# Patient Record
Sex: Female | Born: 1935
Health system: Southern US, Community
[De-identification: ages and names within clinical notes are randomized; demographics above are authoritative.]

---

## 2004-09-10 ENCOUNTER — Ambulatory Visit: Payer: Self-pay | Admitting: Family Medicine

## 2004-11-18 ENCOUNTER — Ambulatory Visit: Payer: Self-pay | Admitting: Family Medicine

## 2004-12-28 ENCOUNTER — Ambulatory Visit: Payer: Self-pay | Admitting: Surgery

## 2005-04-20 ENCOUNTER — Ambulatory Visit: Payer: Self-pay | Admitting: Family Medicine

## 2005-06-07 ENCOUNTER — Other Ambulatory Visit: Payer: Self-pay

## 2005-06-07 ENCOUNTER — Ambulatory Visit: Payer: Self-pay | Admitting: Unknown Physician Specialty

## 2005-06-11 ENCOUNTER — Inpatient Hospital Stay: Payer: Self-pay | Admitting: Unknown Physician Specialty

## 2006-06-15 ENCOUNTER — Ambulatory Visit: Payer: Self-pay | Admitting: Vascular Surgery

## 2006-08-03 ENCOUNTER — Ambulatory Visit: Payer: Self-pay | Admitting: Gastroenterology

## 2007-03-04 ENCOUNTER — Ambulatory Visit: Payer: Self-pay | Admitting: Internal Medicine

## 2007-07-23 ENCOUNTER — Ambulatory Visit: Payer: Self-pay | Admitting: Family Medicine

## 2007-07-26 ENCOUNTER — Ambulatory Visit: Payer: Self-pay | Admitting: Family Medicine

## 2007-11-23 ENCOUNTER — Ambulatory Visit: Payer: Self-pay | Admitting: Family Medicine

## 2007-12-29 ENCOUNTER — Ambulatory Visit: Payer: Self-pay | Admitting: Dermatology

## 2008-03-08 ENCOUNTER — Ambulatory Visit: Payer: Self-pay | Admitting: Dermatology

## 2008-05-31 ENCOUNTER — Ambulatory Visit: Payer: Self-pay | Admitting: Dermatology

## 2008-06-16 ENCOUNTER — Ambulatory Visit: Payer: Self-pay | Admitting: Internal Medicine

## 2008-06-21 ENCOUNTER — Ambulatory Visit: Payer: Self-pay | Admitting: Family Medicine

## 2008-07-10 ENCOUNTER — Ambulatory Visit: Payer: Self-pay | Admitting: Internal Medicine

## 2008-07-13 ENCOUNTER — Inpatient Hospital Stay: Payer: Self-pay | Admitting: Internal Medicine

## 2008-09-10 ENCOUNTER — Ambulatory Visit: Payer: Self-pay | Admitting: Dermatology

## 2008-12-30 ENCOUNTER — Ambulatory Visit: Payer: Self-pay | Admitting: Dermatology

## 2009-02-10 ENCOUNTER — Ambulatory Visit: Payer: Self-pay | Admitting: Family Medicine

## 2009-05-20 ENCOUNTER — Ambulatory Visit: Payer: Self-pay | Admitting: Dermatology

## 2009-08-26 ENCOUNTER — Ambulatory Visit: Payer: Self-pay | Admitting: Family Medicine

## 2009-09-06 ENCOUNTER — Ambulatory Visit: Payer: Self-pay | Admitting: Internal Medicine

## 2009-09-14 ENCOUNTER — Ambulatory Visit: Payer: Self-pay | Admitting: Internal Medicine

## 2010-02-09 ENCOUNTER — Ambulatory Visit: Payer: Self-pay | Admitting: Internal Medicine

## 2010-08-24 ENCOUNTER — Ambulatory Visit: Payer: Self-pay | Admitting: Internal Medicine

## 2011-01-21 ENCOUNTER — Ambulatory Visit: Payer: Self-pay | Admitting: Internal Medicine

## 2011-01-25 ENCOUNTER — Ambulatory Visit: Payer: Self-pay | Admitting: Internal Medicine

## 2012-12-23 ENCOUNTER — Ambulatory Visit: Payer: Self-pay

## 2013-05-15 ENCOUNTER — Inpatient Hospital Stay: Payer: Self-pay | Admitting: Family Medicine

## 2013-05-15 LAB — TROPONIN I
Troponin-I: <0.02 ng/mL
Troponin-I: <0.02 ng/mL
Troponin-I: <0.02 ng/mL

## 2013-05-15 LAB — COMPREHENSIVE METABOLIC PANEL
Albumin: 3 g/dL — ABNORMAL LOW (ref 3.4–5.0)
Alkaline Phosphatase: 116 U/L
Anion Gap: 2 — ABNORMAL LOW (ref 7–16)
BUN: 27 mg/dL — AB (ref 7–18)
Bilirubin,Total: 0.3 mg/dL (ref 0.2–1.0)
CHLORIDE: 106 mmol/L (ref 98–107)
CO2: 30 mmol/L (ref 21–32)
Calcium, Total: 8.6 mg/dL (ref 8.5–10.1)
Creatinine: 0.98 mg/dL (ref 0.60–1.30)
EGFR (African American): >60
GFR CALC NON AF AMER: 55 — AB
Glucose: 112 mg/dL — ABNORMAL HIGH (ref 65–99)
OSMOLALITY: 282 (ref 275–301)
Potassium: 4.2 mmol/L (ref 3.5–5.1)
SGOT(AST): 28 U/L (ref 15–37)
SGPT (ALT): 18 U/L (ref 12–78)
SODIUM: 138 mmol/L (ref 136–145)
TOTAL PROTEIN: 7 g/dL (ref 6.4–8.2)

## 2013-05-15 LAB — CBC
HCT: 38.1 % (ref 35.0–47.0)
HGB: 12.5 g/dL (ref 12.0–16.0)
MCH: 29.4 pg (ref 26.0–34.0)
MCHC: 32.9 g/dL (ref 32.0–36.0)
MCV: 90 fL (ref 80–100)
Platelet: 216 10*3/uL (ref 150–440)
RBC: 4.25 10*6/uL (ref 3.80–5.20)
RDW: 14.8 % — ABNORMAL HIGH (ref 11.5–14.5)
WBC: 5.2 10*3/uL (ref 3.6–11.0)

## 2013-05-15 LAB — CK-MB
CK-MB: 1.8 ng/mL (ref 0.5–3.6)
CK-MB: 1.5 ng/mL (ref 0.5–3.6)
CK-MB: 1.9 ng/mL (ref 0.5–3.6)

## 2013-05-15 LAB — URINALYSIS, COMPLETE
Bilirubin,UR: NEGATIVE
GLUCOSE, UR: NEGATIVE mg/dL (ref 0–75)
Ketone: NEGATIVE
LEUKOCYTE ESTERASE: NEGATIVE
NITRITE: NEGATIVE
PROTEIN: NEGATIVE
Ph: 5 (ref 4.5–8.0)
RBC,UR: 1 /HPF (ref 0–5)
SPECIFIC GRAVITY: 1.015 (ref 1.003–1.030)
Squamous Epithelial: 7
WBC UR: <1 /HPF (ref 0–5)

## 2013-05-16 DIAGNOSIS — I369 Nonrheumatic tricuspid valve disorder, unspecified: Secondary | ICD-10-CM

## 2013-05-16 LAB — D-DIMER(ARMC): D-DIMER: 2498 ng/mL

## 2013-05-16 LAB — TSH: Thyroid Stimulating Horm: 1.13 u[IU]/mL

## 2013-05-17 ENCOUNTER — Ambulatory Visit: Payer: Self-pay | Admitting: Neurology

## 2013-05-17 LAB — CBC WITH DIFFERENTIAL/PLATELET
Basophil #: 0 10*3/uL (ref 0.0–0.1)
Basophil %: 0.7 %
EOS PCT: 3.1 %
Eosinophil #: 0.1 10*3/uL (ref 0.0–0.7)
HCT: 39.7 % (ref 35.0–47.0)
HGB: 13.1 g/dL (ref 12.0–16.0)
Lymphocyte #: 0.5 10*3/uL — ABNORMAL LOW (ref 1.0–3.6)
Lymphocyte %: 10.3 %
MCH: 29.4 pg (ref 26.0–34.0)
MCHC: 32.9 g/dL (ref 32.0–36.0)
MCV: 89 fL (ref 80–100)
MONO ABS: 0.7 x10 3/mm (ref 0.2–0.9)
Monocyte %: 14.1 %
Neutrophil #: 3.3 10*3/uL (ref 1.4–6.5)
Neutrophil %: 71.8 %
PLATELETS: 224 10*3/uL (ref 150–440)
RBC: 4.44 10*6/uL (ref 3.80–5.20)
RDW: 14.7 % — ABNORMAL HIGH (ref 11.5–14.5)
WBC: 4.6 10*3/uL (ref 3.6–11.0)

## 2013-05-17 LAB — BASIC METABOLIC PANEL
ANION GAP: 5 — AB (ref 7–16)
BUN: 12 mg/dL (ref 7–18)
Calcium, Total: 8.2 mg/dL — ABNORMAL LOW (ref 8.5–10.1)
Chloride: 108 mmol/L — ABNORMAL HIGH (ref 98–107)
Co2: 27 mmol/L (ref 21–32)
Creatinine: 0.81 mg/dL (ref 0.60–1.30)
EGFR (African American): >60
EGFR (Non-African Amer.): >60
Glucose: 139 mg/dL — ABNORMAL HIGH (ref 65–99)
OSMOLALITY: 281 (ref 275–301)
Potassium: 4 mmol/L (ref 3.5–5.1)
SODIUM: 140 mmol/L (ref 136–145)

## 2013-05-17 LAB — LIPID PANEL
Cholesterol: 118 mg/dL (ref 0–200)
HDL Cholesterol: 35 mg/dL — ABNORMAL LOW (ref 40–60)
LDL CHOLESTEROL, CALC: 53 mg/dL (ref 0–100)
Triglycerides: 148 mg/dL (ref 0–200)
VLDL CHOLESTEROL, CALC: 30 mg/dL (ref 5–40)

## 2014-06-08 NOTE — Consult Note (Signed)
PATIENT NAME:  Rachael Holland, Rachael Holland MR#:  [id number] DATE OF BIRTH:  May 18, 1932  DATE OF CONSULTATION:  05/17/2013  REFERRING PHYSICIAN:  Dr. Mordecai Maes  CONSULTING PHYSICIAN:  Pauletta Browns, MD  REASON FOR CONSULTATION: Strokes.   HISTORY OF PRESENT ILLNESS: A [age] year old female with past medical history of coronary artery disease status post stenting, history of atrial fibrillation and status post ablation in 2010, hypertension and hyperlipidemia presenting with nausea, dizziness and lightheadedness that was self-relieved. Admitted for questionable stroke status post CAT scan which had a suspicion of right frontal lobe infarct, but on further imaging such as MRI the patient has subacute infarcts in the right corona radiata and right cerebellum. Currently has no complaints, asking to be discharged.   REVIEW OF SYSTEMS:  CONSTITUTIONAL: No fever. Positive fatigue.  EYES: No blurred vision, double vision.  ENT: No ear pain, hearing loss.  RESPIRATORY: No cough, wheezing, hemoptysis.  CARDIOVASCULAR: No chest pain, orthopnea.  GASTROINTESTINAL: Positive for nausea, vomiting, diarrhea.  GENITOURINARY: No dysuria, hematuria.  ENDOCRINE: No polyuria or polydipsia. HEMATOLOGIC AND LYMPHATIC: No easy bruising or bleeding.  SKIN: No rash or lesions.  MUSCULOSKELETAL: No abnormalities.  NEUROLOGICAL: No prior history of strokes.  PSYCHIATRIC: No history of anxiety and depression.   PAST MEDICAL HISTORY: Coronary artery disease status post 2 stents, status post ablation for atrial fibrillation in 2010, asthma, COPD, GERD, restless leg syndrome, hayfever, osteoarthritis, osteoporosis, hypertension, hyperlipidemia, psoriasis, peripheral vascular disease.   PAST SURGICAL HISTORY: Includes tonsillectomy, hysterectomy, lumbosacral decompression and lumbosacral fusion, cardiac stents.   HOME MEDICATIONS: Include tramadol, pramipexole, Neurontin, Nasonex, metoprolol, losartan, Lasix, Crestor, Dilantin,  aspirin, and Brilinta.  ALLERGIES: No known drug allergies.  SOCIAL HISTORY: Lives alone. No tobacco, alcohol or drug use.   FAMILY HISTORY: Positive for coronary artery disease.   DIAGNOSTIC DATA: Laboratory work-up has been reviewed.   Imaging: Ultrasound of carotids: No hemodynamic stenosis.   CT angiogram of chest with PE protocol: No signs of PE.   MRI of the brain without contrast: Remote infarcts in right corona radiata and cerebellum.   PHYSICAL EXAMINATION: VITAL SIGNS: Include a temperature of 97.8, pulse 73, respirations 18, blood pressure 138/81, pulse ox 96% on room air.  NEUROLOGIC: The patient is alert, awake and oriented to time, place and her current location, able to tell me the reason why she is in the hospital. Speech appears to be fluent. No signs of aphasia or dysarthria. Extraocular movements are intact. Visual fields intact. Facial sensation intact. Facial motor is intact. Tongue is midline. Uvula elevates symmetrically. Shoulder shrug intact. Motor strength 5 out of 5 bilateral upper and lower extremities. Coordination: Finger-to-nose intact. Sensation intact to light touch and temperature. Reflexes 2+ symmetrical. Gait not assessed.   IMPRESSION: A 81 year old female with history of coronary artery disease, atrial fibrillation status post ablation, hypertension and hyperlipidemia presenting with dizziness which has resolved. The patient states she is back to baseline. Imaging has found, specifically MRI, right coronal radiata and right cerebellar infarct which are in the intravascular territories. The corona radiata is supplied by deep branches of the MCA, lenticular strip branches, as well as deep branches of anterior cerebral artery. The cerebellum is supplied by posterior circulation. The patient is status post echocardiogram with normal ejection fraction. No signs of any clot. I suspect this could be a cardioembolic source as strokes are remote but they are in  different vascular territories. The patient does have history of atrial fibrillation, but she is status post ablation and states  that she has not had any palpitations recently.   PLAN: Would place the patient on telemetry, increase her aspirin to 325 daily and obtain ultrasound of lower extremities to make sure there are no clots. If possible would like to obtain TEE. That can be done as outpatient, but if the patient does stay here for 1 more day we can make her n.p.o. after midnight and obtain TEE to make sure there is no clot there that would need anticoagulation. If that is negative, when the patient is discharged home, she should be placed on a Holter monitor to make sure there are no signs of atrial fibrillation or any other cardiac arrhythmia.   Thank you. It was a pleasure seeing this patient. This case was discussed with Dr. Mordecai MaesSanchez.   ____________________________ Pauletta BrownsYuriy Matayah Reyburn, MD yz:sb D: 05/17/2013 10:15:00 ET T: 05/17/2013 10:44:15 ET JOB#: [id number]  cc: Pauletta BrownsYuriy Taylorann Tkach, MD, <Dictator> Pauletta BrownsYURIY Eilis Chestnutt MD ELECTRONICALLY SIGNED 05/18/2013 11:35

## 2014-06-08 NOTE — H&P (Signed)
PATIENT NAME:  Rachael Holland, Rachael Holland MR#:  [id number] DATE OF BIRTH:  [birth year]  DATE OF ADMISSION:  05/15/2013  PRIMARY CARE PHYSICIAN: Dr. Petra Kuba  CHIEF COMPLAINT: Nausea, dizziness, lightheadedness.   HISTORY OF PRESENT ILLNESS: This is a 81 year old, very pleasant female with a history of CAD, status post stent, hypertension, hyperlipidemia, presents with the above complaint. Over the past day, the patient has had lightheadedness, dizziness, and feeling very fatigued. She came to the ER for further evaluation. In the ER, she had a CT scan, which showed an old anterior right frontal lobe infarct, which she is unaware of, but also it was noted that her heart rates are between 35 to 60. When she is up and talking, her heart rates are about 50s and 60s, but when she is lying and resting, her heart goes down.   REVIEW OF SYSTEMS: CONSTITUTIONAL: No fever. Positive fatigue for one day, and weakness.  EYES:  No blurred or double vision, glaucoma. ENT: No ear pain, hearing loss, seasonal allergies. RESPIRATORY: No cough, wheezing, hemoptysis, COPD.   CARDIOVASCULAR: No chest pain, orthopnea, edema, arrhythmia, dyspnea on exertion, palpitations, syncope.   GASTROINTESTINAL: Positive nausea. No vomiting, diarrhea, abdominal pain, melena, or ulcers.  GENITOURINARY: No dysuria or hematuria.  ENDOCRINE: No polyuria or polydipsia.  HEMATOLOGIC/LYMPHATIC: No easy bruising or bleeding.  SKIN: No rash or lesions.  MUSCULOSKELETAL: No limited activity.  NEUROLOGIC: No history of CVA seizures.  PSYCHIATRIC:  No history of anxiety or depression.   PAST MEDICAL HISTORY:  1.  History of coronary artery disease, status post 2 stents.  2.  Asthma/chronic obstructive pulmonary disease. 3.  Atrial fibrillation.  4.  GERD.  5.  Restless legs syndrome.  6.  Hayfever.  7.  Osteoarthritis.  8.  Osteoporosis.  9.  Hypertension.  10.  Hyperlipidemia. 11.  Psoriasis.  12.  Peripheral vascular disease.   SURGICAL  HISTORY: 1.  Tonsillectomy and adenoidectomy.  2.  Hysterectomy.  3.  Lumbosacral decompression.  4.  Lumbosacral fusion x 2. 5.  Cardiac stents.   MEDICATIONS: 1.  Tramadol 50 mg 1 tablet q. 4 hours p.r.n.  2.  Pramipexole 0.25 mg daily.  3.  Neurontin 600 mg at bedtime.  4.  Nasonex 2 puffs daily.  5.  Metoprolol 25 b.i.d. 6.  Losartan 25 mg daily.  7.  Lasix 20 mg p.r.n.  8.  Crestor 20 mg daily.  9.  Dilantin 90 mg b.i.d.  10.  Aspirin 81 mg daily. 11.  Brillanta 90 mg BID  ALLERGIES: No known drug allergies.  SOCIAL HISTORY:  No tobacco, alcohol, or drug use.   FAMILY HISTORY: Positive for early CAD. Her father passed away at age 36 with CAD. Mother died at 61 with bowel cancer.   PHYSICAL EXAMINATION: VITAL SIGNS: Temperature 98.7, pulse 57, respirations 18, blood pressure 145/74, 96% on room air.  GENERAL: The patient is alert, oriented, not in acute distress. HEENT: Head is atraumatic. Pupils are reactive. Sclerae anicteric. Mucous membranes are moist. Oropharynx is clear.  NECK: Supple, without JVD, carotid bruit, or enlarged thyroid.  CARDIOVASCULAR: Bradycardia, without murmur, gallops, or rubs. PMI is not displaced. LUNGS:  Clear to auscultation, without crackles, rales, rhonchi, or wheezing. Normal percussion.  ABDOMEN:  Bowel sounds are present. Nontender, nondistended. No hepatosplenomegaly. EXTREMITIES: Without clubbing, cyanosis, or edema.  NEUROLOGIC:  Cranial nerves II through XII are intact. There are no focal deficits.  SKIN: Without rash or lesions.   LABORATORIES: Sodium 138, potassium 4.2, chloride  106, bicarb 30, BUN 27, creatinine 0.92, glucose 112. Total protein 7.0, albumin 3.0, bilirubin 0.3, alk phos 116, AST 28, ALT 18. Troponin x 2 is less than 0.02. White blood cells 5.2, hemoglobin 12.5, hematocrit 38.1, platelets are 260. Urinalysis shows no LCE or nitrites.   EKG:  Sinus bradycardia. No ST elevation or depression.   ASSESSMENT AND PLAN: A  81 year old female who is on metoprolol for coronary artery disease, who presents with nausea, dizziness, found to have bradycardia.   1. Bradycardia. The patient is symptomatic from her bradycardia, with nausea, dizziness, lightheadedness, and fatigue. Will hold metoprolol, monitor the patient on telemetry, order an echocardiogram, order a TSH, and complete her cardiac enzyme panel. At this time, I do not feel that patient needs a Cardiology consultation. However, due to the fact that patient, when she is resting, she has bradycardia. I think if we hold the metoprolol, her heart rate may be better. If it continues to be low and she is symptomatic, then we will obtain a Cardiology consultation. She does not have a cardiologist preference, as she does see Dr. Petra Kuba, who does not come to [facility].   2. Hypertension. I will continue losartan, but I have stopped metoprolol for the above issues. The patient may need another hypertenisve medication at discharge, in addition to the losartan.   3.  Hyperlipidemia. I will continue her statin medication.   4.  History of paroxysmal atrial fibrillation. The patient is not in atrial fibrillation at this time. I will continue Brillanta. I am stopping metoprolo due to her bradycardia.  5.  Restless legs syndrome. I will continue Mirapex.   6.  Abnormal head CT. CT scan shows an old infarct. She is unaware of this. Due to her admitting symptoms, I will order an MRi to evalaute for an acute CVA.  If it is positive for an acute CVA, she will need further CVA workup. I have ordered neuro checks q. 4 hours as well.   7.  The patient is FULL CODE STATUS.   Time spent:  Approximately 45 minutes.    ____________________________ Donell Beers. Benjie Karvonen, MD spm:mr D: 05/15/2013 18:35:37 ET T: 05/15/2013 19:03:34 ET JOB#: [id number]  cc: Thalia Turkington P. Benjie Karvonen, MD, <Dictator> Hall Busing. Petra Kuba, MD  Donell Beers Mirranda Monrroy MD ELECTRONICALLY SIGNED 05/16/2013 13:10

## 2014-06-08 NOTE — Discharge Summary (Signed)
PATIENT NAME:  Rachael Holland, AMORE MR#:  [id number] DATE OF BIRTH:  Mar 30, 1932  DATE OF ADMISSION:  05/15/2013 DATE OF DISCHARGE:  05/18/2013  REASON FOR ADMISSION:  Dizziness, lightheadedness, pre-syncopal episode.   DISCHARGE DIAGNOSES:  1. Cerebrovascular accident of the corona radiata and cerebellar. They were remote. As per  neurology, within 2-5 days.  2. Bradycardia, resolved. 3. The patient was on metoprolol.  4. Paroxysmal atrial fibrillation, now with normal sinus rhythm after ablation in the past.  5. Dizziness, lightheadedness as mentioned above.  6. Severe pulmonary hypertension with pulmonary artery pressure of 66 mmHg.  7. Mediastinal and axillary adenopathy.  8. Atrial fibrillation.  9. Hypertension.  10. Hyperlipidemia.  11. Did notice the patient was on normal sinus rhythm all the time that she was in the hospital.   DISPOSITION: Home.   DISCHARGE MEDICATIONS:  1. Casodex 50 mcg 2 sprays once daily. 2. Brilinta 90 mg twice daily.  3. Metoprolol 25 mg twice daily.  4. Losartan 25 mg daily.  5. Crestor 20 mg once a day.  6. Lasix 20 mg once a day.  7. Pramipexole 0.25 mg once a day.  8. Tramadol 50 mg every 4 hours. 9. Neurontin 600 mg once a day.  10. Aspirin 325 mg once a day.   FOLLOWUP: 1. Dr. Delton Prairie,  PCP at [facility] 2. Dr. Graciela Husbands, [facility].  3. ENT. 4. Referral to pulmonary.   REFERRALS:  Message for PCP, Dr. Mariana Kaufman.  1. Please refer the patient for Holter monitor, 30 days extended.  2. Refer to Dr. Graciela Husbands at cardiology [facility].  3. Please refer to general surgery for biopsy, if possible, of nodules and lymph nodes of the mediastinum, hilium and  axillary area.  4. Please arrange mammogram. 5. Please refer back to cardiology for possible right heart catheterization to evaluate pulmonary hypertension.  6. Please arrange pulmonary referral for evaluation of possible obstructive sleep apnea.   HOSPITAL COURSE: Patient is a very nice  81 year old female with history of previous atrial fibrillation with RVR in the past status post catheterization and ablation. The patient also has history of coronary artery disease and she has a stent placed now, is taking Brilinta and aspirin.  Patient was presented to the Emergency Department with a complaint of lightheadedness and dizziness, feeling very fatigued while at work, and the CT scan of the head shows an old anterior  right frontal lobe infarct but the patient was not aware of this ever happening to her. Her heart rate was about 35-60 for which the metoprolol was stopped and the patient was admitted for evaluation and treatment. The patient states that she had a previous episode that was similar to that about a week ago.  Due to the tachycardia, the patient was admitted. TSH was done. Echocardiogram was done.  TSH showing significant abnormalities.  The echocardiogram showed a significant pulmonary hypertension.   The patient was asymptomatic and at the time of discharge metoprolol was started again, since the patient has significant history of coronary artery disease.  The patient's heart rate was in the 70s-80s.   Acute CVA. Patient had on the CT scan a right frontal.  Neurology thinks that this may be an artifact non seen in the MRI. The MRI showed a right corona radiata and cerebellar CVA.   Doppler of the carotid arteries was done showing good vertebral arteries with antegrade flow and no significant stenosis at the level of the carotid arteries. The patient was  taking previously aspirin 81 mg daily for what was increased to 325 mg. The patient was already taking Crestor and her LDL was below 70 Her LDL was actually just 53 with an HDL of 35.   Recommendations for patient of exercise and diet including omega-3 fatty acids.    Since the patient has history of atrial fibrillation in the past, she is off any anticoagulation and she had an ablation. She apparently has been having these  occurred events, but with a multiple focus and the CVAs that were found, including posterior and anterior circulation, likely due to the cardioembolic event for which the patient is recommended to go back to her cardiologist[facility], and have a Holter monitor. A TEE was done without showing any significant thrombi in the heart. No PFO. No significant valvulopathies. The transesophageal echocardiogram was performed by Dr. Gwen PoundsKowalski. The patient needs to be evaluated again by cardiology, also for the need of possible chronic anticoagulation.  At this moment, the patient is going to on aspirin and Brilinta, but if there are any signs of atrial fibrillation, she will need to go on anticoagulation.   ASSESSMENT AND PLAN: Dizziness and lightheadedness. This was multifactorial.   1. The strokes affecting the cerebellar area.  2. Recurrent otitis media. MRI showed right mastoid diffusion. The patient to follow up outpatient with ENT.  3. Pulmonary hypertension with elevation of blood pressure and pulmonary artery 66 mmHg.  The patient had no significant changes or significant strain of the ventricle.  No tortuosity  at the level of pulmonary artery   She states that this has been going on for a long time, for which a D-dimer was sent and D-dimer was slightly elevated.  A CT scan of the chest was done showing no significant signs of pulmonary embolus.  4. The patient states that she used to snore and that she was told a very long time ago. aAt this pointshe lives by herself  so she does not know if she snores.  This could be central apnea from the  strokes, for which we recommended a followup with pulmonary and possible sleep study.   Dr. Belia HemanKasa evaluated the patient and recommended probable followup as an outpatient. 5. Mediastinal lymphadenopathy with axillary lymphadenopathy in the regions of the pectoralis and axila.  , emphysematous changes. Ten millimeter left adrenal nodule. Consider MRI as an  outpatient.  Please Dr. Mariana Kaufmanobin follow up on this issue.  6. Lymph nodes in between 1.3 and 1.5 cm with prominent lymphoid tissue on both hila of the lungs, small pleural effusions, and again Dr. Mariana Kaufmanobin  will follow up on mammogram, and a biopsy of the nodules for evaluation of lymphoma versus other pathology or breast cancer.  7.    hypertension was controlled as far as coronary artery disease. Continue Brilinta and aspirin.    I spent about 60 minutes discharging this patient on the day of discharge.    ____________________________ Felipa Furnaceoberto Sanchez Gutierrez, MD rsg:dd D: 05/20/2013 22:03:00 ET T: 05/20/2013 23:30:49 ET JOB#: [id number]  cc: Vic RipperPaul C. Mariana Kaufmanobin, MD Dr. Sharin MonsKlein Gurjot Brisco Berlinda LastSanchez Gutierrez, MD, <Dictator>    Kaire Stary Juanda ChanceSANCHEZ GUTIERRE MD ELECTRONICALLY SIGNED 05/22/2013 21:28

## 2015-02-16 DEATH — deceased

## 2016-03-10 IMAGING — MR MRI HEAD WITHOUT CONTRAST
9 of 10 series · 41 of 48 positions shown · non-contrast
Comparison: CT head without contrast 05/15/2013.

CLINICAL DATA: Dizziness and lightheaded. Difficulty walking.
Sweating.

EXAM:
MRI HEAD WITHOUT CONTRAST
TECHNIQUE: Multiplanar, multiecho pulse sequences of the brain and surrounding
structures were obtained without intravenous contrast.

[Series 5: DWI · axial · 5.0mm · 1.80mm/px · z∈[-39,+98]mm · 3 of 24 slices shown (1 of 2)]
[im 1/24]
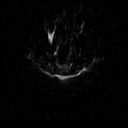
[im 12/24]
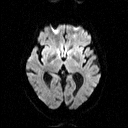
[im 24/24]
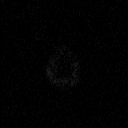

[Series 6: ADC · axial · 5.0mm · 1.80mm/px · z∈[-39,+98]mm · 3 of 24 slices shown (1 of 2)]
[im 1/24]
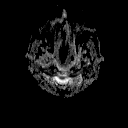
[im 12/24]
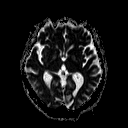
[im 24/24]
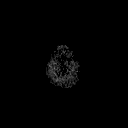

[Series 9: DWI · coronal · 5.0mm · 1.80mm/px · 5 of 35 slices shown (2 of 2)]
[im 1/35]
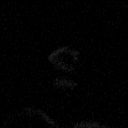
[im 9/35]
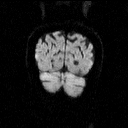
[im 18/35]
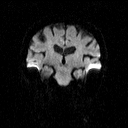
[im 26/35]
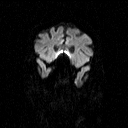
[im 35/35]
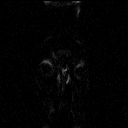

[Series 10: ADC · coronal · 5.0mm · 1.80mm/px · 6 of 35 slices shown (2 of 2)]
[im 1/35]
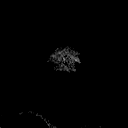
[im 7/35]
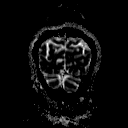
[im 14/35]
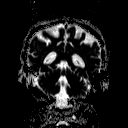
[im 21/35]
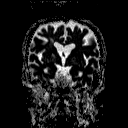
[im 28/35]
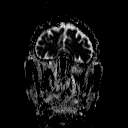
[im 35/35]
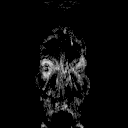

[Series 11: T2 · axial · 5.0mm · 0.43mm/px · z∈[-42,+96]mm · 4 of 24 slices shown (1 of 3)]
[im 1/24]
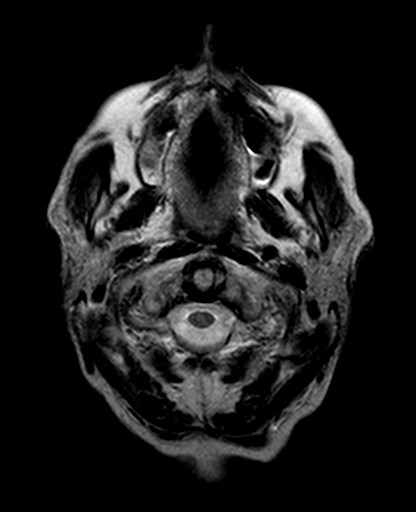
[im 8/24]
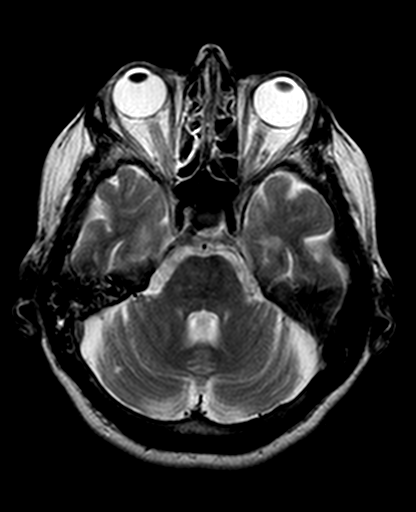
[im 16/24]
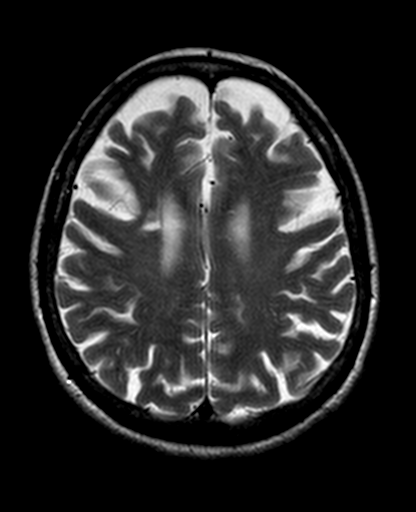
[im 24/24]
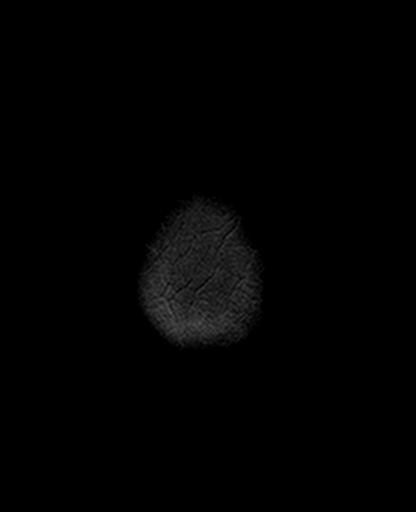

[Series 12: FLAIR · axial · 5.0mm · 0.86mm/px · z∈[-42,+96]mm · 4 of 24 slices shown]
[im 1/24]
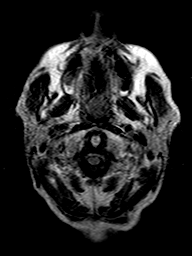
[im 8/24]
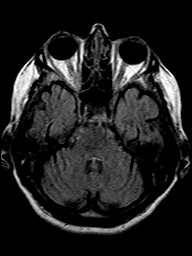
[im 16/24]
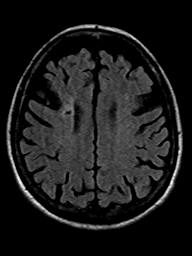
[im 24/24]
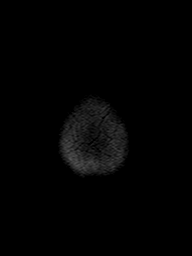

[Series 13: T2 · axial · 5.0mm · 0.43mm/px · z∈[-42,+96]mm · 4 of 24 slices shown (2 of 3)]
[im 1/24]
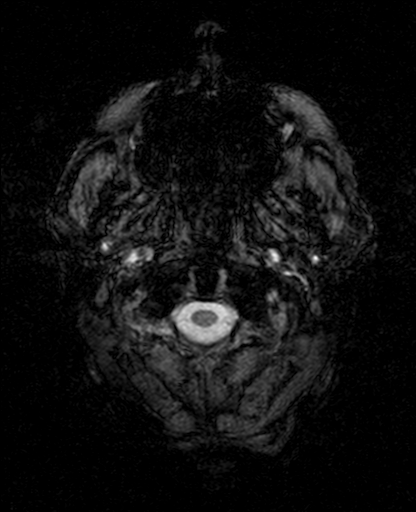
[im 8/24]
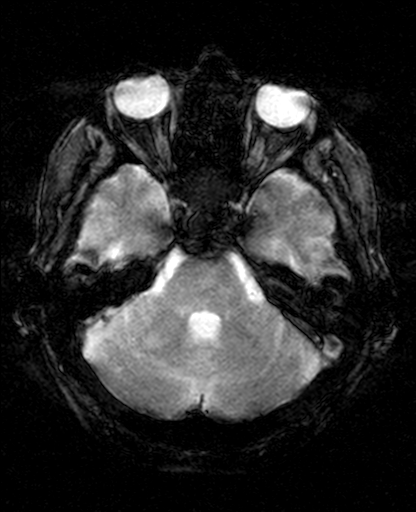
[im 16/24]
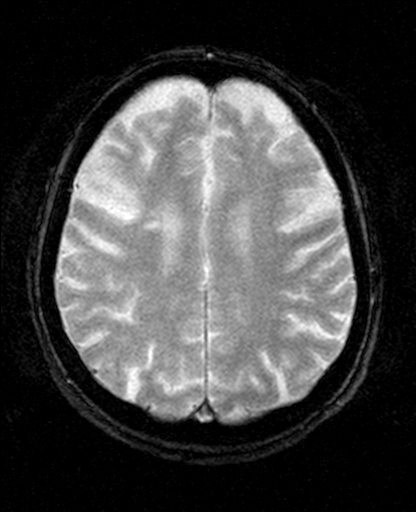
[im 24/24]
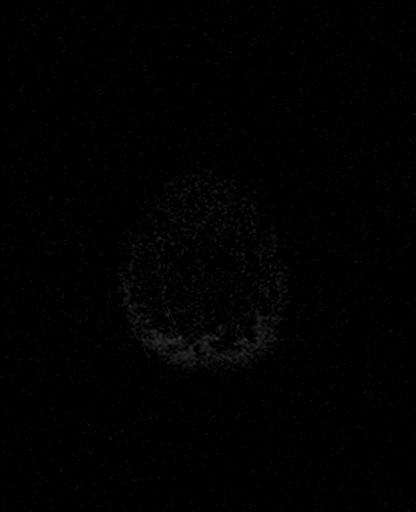

[Series 14: T1 · axial · 3.0mm · 0.45mm/px · z∈[-47,+86]mm · 7 of 56 slices shown]
[im 1/56]
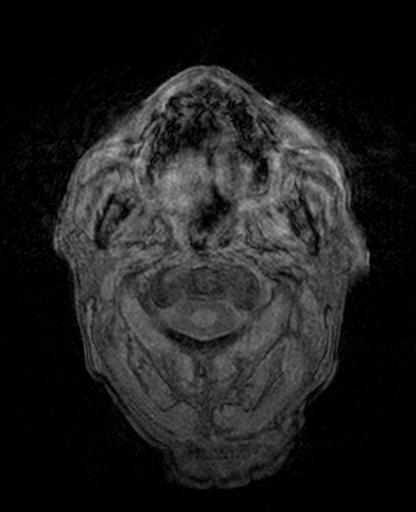
[im 7/56]
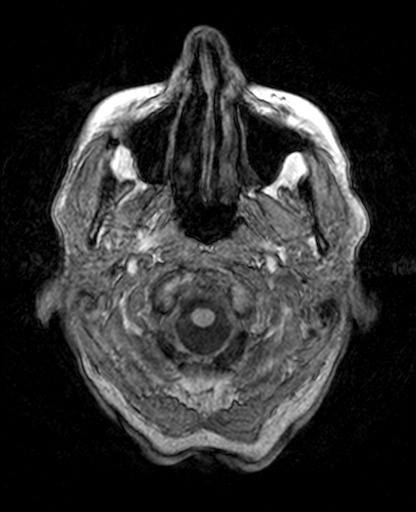
[im 14/56]
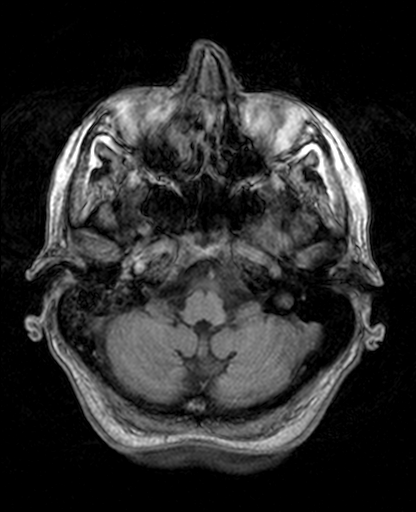
[im 21/56]
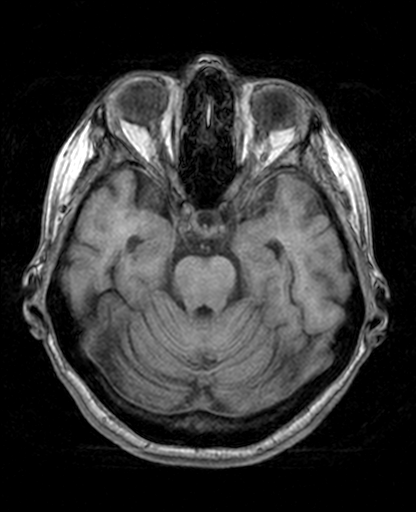
[im 35/56]
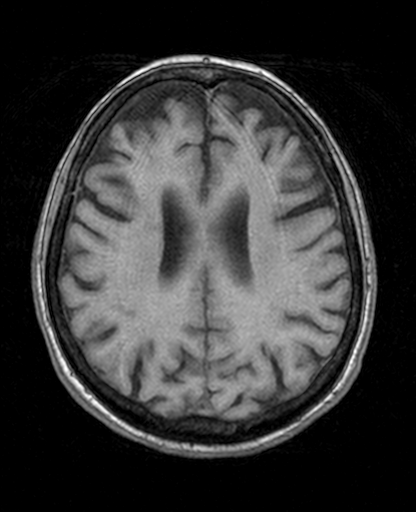
[im 42/56]
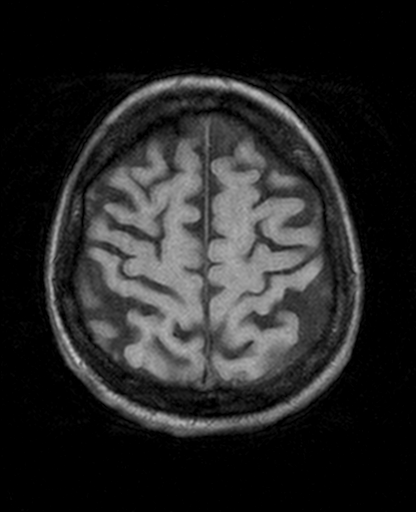
[im 49/56]
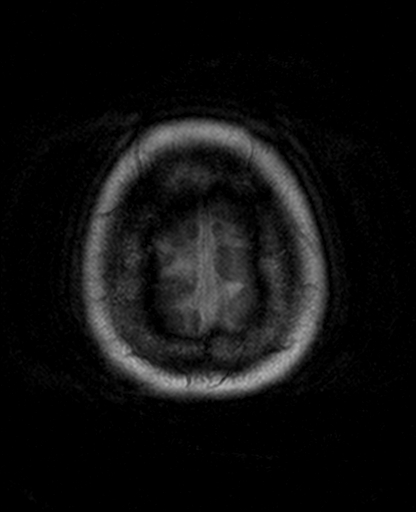

[Series 15: T2 · coronal · 5.0mm · 0.43mm/px · 5 of 29 slices shown (3 of 3)]
[im 1/29]
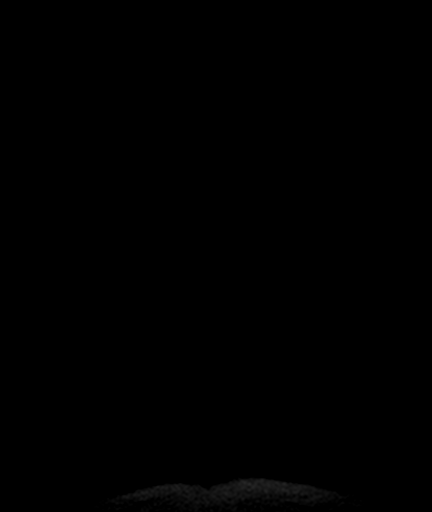
[im 8/29]
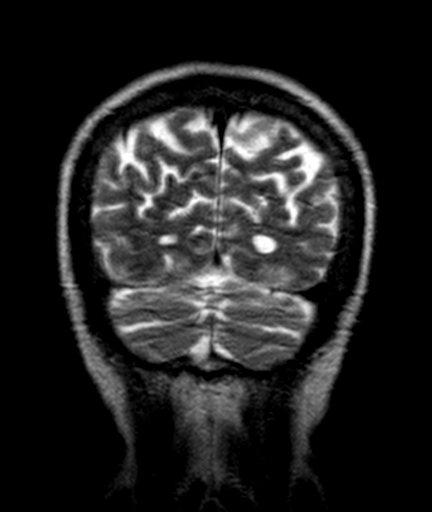
[im 15/29]
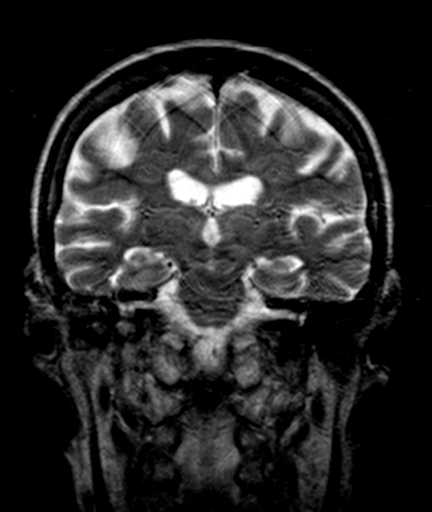
[im 22/29]
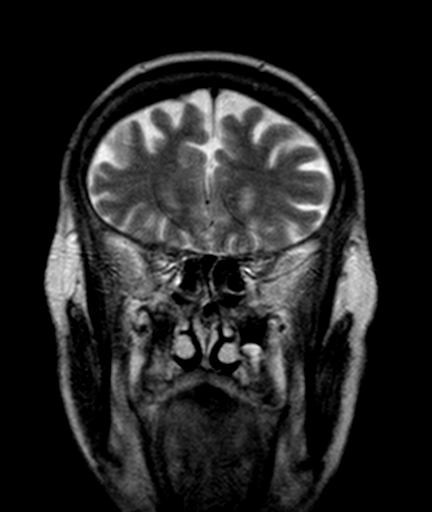
[im 29/29]
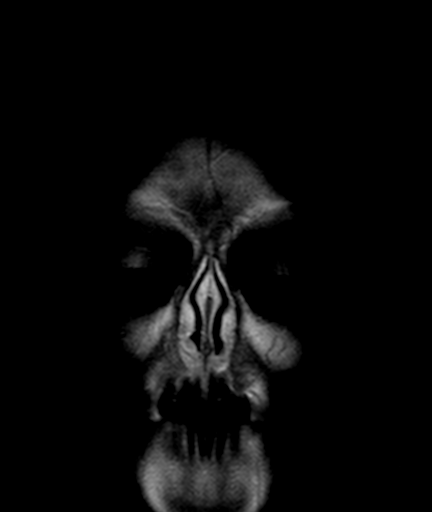

[41 of 48 positions shown; findings below may reference images not displayed]

FINDINGS: A remote lacunar infarct is present in the right coronal radiata. No
acute cortical infarct, hemorrhage, or mass lesion is present. A
much smaller remote lacunar infarct is present within the right
cerebellum. The ventricles are of normal size. No significant
extra-axial fluid collection is present.

Flow is present in the major intracranial arteries. The globes and
orbits are intact. A small fluid level is present in the left
sphenoid sinus as before. Mucosal thickening is present in the
ethmoid air cells bilaterally, right greater than left. A right
mastoid effusion is present. A right mastoid effusion is new. No
obstructing nasopharyngeal lesion is evident.
IMPRESSION: 1. No acute or focal lesion to explain the patient's symptoms.
2. Remote lacunar infarcts of the right corona radiata and
cerebellum.
3. Minimal left sphenoid sinus disease.
4. Knee right mastoid effusion. No obstructing nasopharyngeal lesion
is evident.
